# Patient Record
Sex: Female | Born: 1960 | Race: White | Hispanic: No | Marital: Married | Smoking: Current every day smoker
Health system: Southern US, Community
[De-identification: ages and names within clinical notes are randomized; demographics above are authoritative.]

## PROBLEM LIST (undated history)

## (undated) DIAGNOSIS — I509 Heart failure, unspecified: Secondary | ICD-10-CM

## (undated) DIAGNOSIS — J449 Chronic obstructive pulmonary disease, unspecified: Secondary | ICD-10-CM

## (undated) DIAGNOSIS — I429 Cardiomyopathy, unspecified: Secondary | ICD-10-CM

## (undated) HISTORY — PX: APPENDECTOMY: SHX54

## (undated) HISTORY — PX: HYSTEROPLASTY: SHX988

## (undated) HISTORY — PX: NECK EXPLORATION: SHX2077

---

## 2015-08-07 ENCOUNTER — Emergency Department (HOSPITAL_COMMUNITY): Payer: Medicaid Other

## 2015-08-07 ENCOUNTER — Encounter (HOSPITAL_COMMUNITY): Payer: Self-pay | Admitting: Nurse Practitioner

## 2015-08-07 ENCOUNTER — Emergency Department (HOSPITAL_COMMUNITY)
Admission: EM | Admit: 2015-08-07 | Discharge: 2015-08-07 | Disposition: A | Discharge status: Home / Self Care | Payer: Medicaid Other | Attending: Emergency Medicine | Admitting: Emergency Medicine

## 2015-08-07 DIAGNOSIS — Z7901 Long term (current) use of anticoagulants: Secondary | ICD-10-CM | POA: Diagnosis not present

## 2015-08-07 DIAGNOSIS — Z7951 Long term (current) use of inhaled steroids: Secondary | ICD-10-CM | POA: Diagnosis not present

## 2015-08-07 DIAGNOSIS — Z720 Tobacco use: Secondary | ICD-10-CM | POA: Insufficient documentation

## 2015-08-07 DIAGNOSIS — J449 Chronic obstructive pulmonary disease, unspecified: Secondary | ICD-10-CM | POA: Diagnosis not present

## 2015-08-07 DIAGNOSIS — R1013 Epigastric pain: Secondary | ICD-10-CM | POA: Diagnosis present

## 2015-08-07 DIAGNOSIS — Z792 Long term (current) use of antibiotics: Secondary | ICD-10-CM | POA: Diagnosis not present

## 2015-08-07 DIAGNOSIS — I509 Heart failure, unspecified: Secondary | ICD-10-CM | POA: Insufficient documentation

## 2015-08-07 DIAGNOSIS — R1011 Right upper quadrant pain: Secondary | ICD-10-CM

## 2015-08-07 DIAGNOSIS — Z79899 Other long term (current) drug therapy: Secondary | ICD-10-CM | POA: Insufficient documentation

## 2015-08-07 DIAGNOSIS — K8020 Calculus of gallbladder without cholecystitis without obstruction: Secondary | ICD-10-CM

## 2015-08-07 HISTORY — DX: Cardiomyopathy, unspecified: I42.9

## 2015-08-07 HISTORY — DX: Heart failure, unspecified: I50.9

## 2015-08-07 HISTORY — DX: Chronic obstructive pulmonary disease, unspecified: J44.9

## 2015-08-07 LAB — COMPREHENSIVE METABOLIC PANEL
ALT: 48 U/L (ref 14–54)
ANION GAP: 9 (ref 5–15)
AST: 61 U/L — ABNORMAL HIGH (ref 15–41)
Albumin: 3.9 g/dL (ref 3.5–5.0)
Alkaline Phosphatase: 120 U/L (ref 38–126)
BUN: 22 mg/dL — ABNORMAL HIGH (ref 6–20)
CHLORIDE: 107 mmol/L (ref 101–111)
CO2: 25 mmol/L (ref 22–32)
Calcium: 9.3 mg/dL (ref 8.9–10.3)
Creatinine, Ser: 0.8 mg/dL (ref 0.44–1.00)
Glucose, Bld: 91 mg/dL (ref 65–99)
POTASSIUM: 3.6 mmol/L (ref 3.5–5.1)
SODIUM: 141 mmol/L (ref 135–145)
Total Bilirubin: 0.5 mg/dL (ref 0.3–1.2)
Total Protein: 7 g/dL (ref 6.5–8.1)

## 2015-08-07 LAB — CBC WITH DIFFERENTIAL/PLATELET
Basophils Absolute: 0.1 10*3/uL (ref 0.0–0.1)
Basophils Relative: 1 %
EOS ABS: 0.3 10*3/uL (ref 0.0–0.7)
EOS PCT: 3 %
HCT: 39.7 % (ref 36.0–46.0)
Hemoglobin: 13.3 g/dL (ref 12.0–15.0)
LYMPHS ABS: 1.4 10*3/uL (ref 0.7–4.0)
LYMPHS PCT: 13 %
MCH: 29 pg (ref 26.0–34.0)
MCHC: 33.5 g/dL (ref 30.0–36.0)
MCV: 86.7 fL (ref 78.0–100.0)
MONO ABS: 0.7 10*3/uL (ref 0.1–1.0)
Monocytes Relative: 6 %
NEUTROS PCT: 77 %
Neutro Abs: 8.5 10*3/uL — ABNORMAL HIGH (ref 1.7–7.7)
PLATELETS: 232 10*3/uL (ref 150–400)
RBC: 4.58 MIL/uL (ref 3.87–5.11)
RDW: 14.7 % (ref 11.5–15.5)
WBC: 11 10*3/uL — ABNORMAL HIGH (ref 4.0–10.5)

## 2015-08-07 LAB — URINALYSIS, ROUTINE W REFLEX MICROSCOPIC
Bilirubin Urine: NEGATIVE
Glucose, UA: NEGATIVE mg/dL
Hgb urine dipstick: NEGATIVE
KETONES UR: NEGATIVE mg/dL
NITRITE: NEGATIVE
PH: 6.5 (ref 5.0–8.0)
Protein, ur: NEGATIVE mg/dL
SPECIFIC GRAVITY, URINE: 1.019 (ref 1.005–1.030)
Urobilinogen, UA: 1 mg/dL (ref 0.0–1.0)

## 2015-08-07 LAB — URINE MICROSCOPIC-ADD ON

## 2015-08-07 LAB — LIPASE, BLOOD: LIPASE: 40 U/L (ref 11–51)

## 2015-08-07 MED ORDER — HYDROMORPHONE HCL 1 MG/ML IJ SOLN
1.0000 mg | Freq: Once | INTRAMUSCULAR | Status: AC
  Administered 2015-08-07: 1 mg via INTRAVENOUS
  Filled 2015-08-07: qty 1

## 2015-08-07 MED ORDER — OXYCODONE-ACETAMINOPHEN 5-325 MG PO TABS: 1.0000 | ORAL_TABLET | ORAL | Status: AC | PRN

## 2015-08-07 MED ORDER — SODIUM CHLORIDE 0.9 % IV BOLUS (SEPSIS)
500.0000 mL | Freq: Once | INTRAVENOUS | Status: AC
  Administered 2015-08-07: 500 mL via INTRAVENOUS

## 2015-08-07 MED ORDER — PROMETHAZINE HCL 25 MG PO TABS: 25.0000 mg | ORAL_TABLET | Freq: Four times a day (QID) | ORAL | Status: AC | PRN

## 2015-08-07 MED ORDER — IOHEXOL 300 MG/ML  SOLN
100.0000 mL | Freq: Once | INTRAMUSCULAR | Status: AC | PRN
  Administered 2015-08-07: 100 mL via INTRAVENOUS

## 2015-08-07 MED ORDER — PROMETHAZINE HCL 25 MG/ML IJ SOLN
12.5000 mg | Freq: Once | INTRAMUSCULAR | Status: AC
  Administered 2015-08-07: 12.5 mg via INTRAVENOUS
  Filled 2015-08-07: qty 1

## 2015-08-07 NOTE — ED Notes (Signed)
Pt is presented in mild pain induced RUQ, hx of gallstones with hospitalization for liver related issues. Other symptoms include n/v, also being treated IV phlebitis from recent hospitalization. [place] is from [place] town, all hx is being provided by pt as chart review isn't yielding much.

## 2015-08-07 NOTE — ED Notes (Signed)
US at bedside

## 2015-08-07 NOTE — Discharge Instructions (Signed)
Medication for pain and nausea. Nurse will give you copies of the CT scan and ultrasound to take back to [place]. Recommend close follow-up early in week for your abdominal pain. Avoid rich greasy foods.

## 2015-08-07 NOTE — ED Notes (Signed)
Bed: [id number] Expected date: 08/07/15 Expected time: 3:56 PM Means of arrival: Ambulance Comments: Gall Stones

## 2015-08-07 NOTE — ED Notes (Signed)
Pt cannot use restroom at this time, aware specimen is needed. 

## 2015-08-08 NOTE — ED Provider Notes (Signed)
CSN: [id number]     Arrival date & time 08/07/15  1604 History   First MD Initiated Contact with Patient 08/07/15 1612     Chief Complaint  Patient presents with  . Abdominal Pain    R/O Gall Stones     (Consider location/radiation/quality/duration/timing/severity/associated sxs/prior Treatment) HPI.... Epigastric pain for approximate 2 weeks, worse the past 3 days. Patient is from [place] and has been diagnosed with gallbladder disease. Sxs are still being evaluated and  no final determination has been made for surgery. No fever, sweats, chills, jaundice. She has taken nothing for pain at home. Severity is moderate. No radiation of pain.  Past Medical History  Diagnosis Date  . COPD (chronic obstructive pulmonary disease) (HCC)   . Cardiomyopathy (HCC)   . CHF (congestive heart failure) [facility])    Past Surgical History  Procedure Laterality Date  . Appendectomy    . Hysteroplasty    . Neck exploration     History reviewed. No pertinent family history. Social History  Substance Use Topics  . Smoking status: Current Every Day Smoker -- 0.50 packs/day    Types: Cigarettes  . Smokeless tobacco: Former Neurosurgeon  . Alcohol Use: No   OB History    No data available     Review of Systems    Allergies  Zofran; Nsaids; and Tramadol  Home Medications   Prior to Admission medications   Medication Sig Start Date End Date Taking? Authorizing Provider  albuterol (PROVENTIL HFA;VENTOLIN HFA) 108 (90 BASE) MCG/ACT inhaler Inhale 2 puffs into the lungs every 6 (six) hours as needed for wheezing or shortness of breath.   Yes Historical Provider, MD  albuterol (PROVENTIL) (2.5 MG/3ML) 0.083% nebulizer solution Take 2.5 mg by nebulization every 8 (eight) hours.   Yes Historical Provider, MD  alprazolam Prudy Feeler) 2 MG tablet Take 2 mg by mouth daily as needed for anxiety.   Yes Historical Provider, MD  budesonide-formoterol (SYMBICORT) 80-4.5 MCG/ACT inhaler Inhale 2 puffs into the lungs 2  (two) times daily.   Yes Historical Provider, MD  Cholecalciferol (VITAMIN D3) 2000 UNITS TABS Take 1 tablet by mouth daily.   Yes Historical Provider, MD  clindamycin (CLEOCIN) 300 MG capsule Take 300 mg by mouth every 6 (six) hours.   Yes Historical Provider, MD  clonazePAM (KLONOPIN) 2 MG tablet Take 2 mg by mouth 2 (two) times daily.   Yes Historical Provider, MD  diltiazem (CARDIZEM CD) 120 MG 24 hr capsule Take 120 mg by mouth daily.   Yes Historical Provider, MD  furosemide (LASIX) 20 MG tablet Take 20 mg by mouth daily.     Historical Provider, MD  HYDROcodone-acetaminophen (NORCO) 10-325 MG tablet Take 1-2 tablets by mouth every 6 (six) hours as needed for moderate pain.   Yes Historical Provider, MD  levalbuterol (XOPENEX) 1.25 MG/0.5ML nebulizer solution Take 1.25 mg by nebulization every 4 (four) hours as needed for wheezing or shortness of breath.   Yes Historical Provider, MD  nystatin (MYCOSTATIN) 100000 UNIT/ML suspension Take 5 mLs by mouth every 8 (eight) hours.    Historical Provider, MD  oxyCODONE-acetaminophen (PERCOCET) 5-325 MG tablet Take 1-2 tablets by mouth every 4 (four) hours as needed. 08/07/15   Donnetta Hutching, MD  pantoprazole (PROTONIX) 40 MG tablet Take 40 mg by mouth daily.   Yes Historical Provider, MD  potassium chloride (K-DUR) 10 MEQ tablet Take 10 mEq by mouth daily.   Yes Historical Provider, MD  promethazine (PHENERGAN) 25 MG tablet Take 1 tablet (  25 mg total) by mouth every 6 (six) hours as needed for nausea or vomiting. 08/07/15   Donnetta Hutching, MD  rivaroxaban (XARELTO) 20 MG TABS tablet Take 20 mg by mouth.    Historical Provider, MD  tiotropium (SPIRIVA) 18 MCG inhalation capsule Place 18 mcg into inhaler and inhale daily.   Yes Historical Provider, MD   BP 139/88 mmHg  Pulse 79  Temp(Src) 97.6 F (36.4 C) (Oral)  Resp 12  SpO2 100% Physical Exam  Constitutional: She is oriented to person, place, and time. She appears well-developed and well-nourished.   HENT:  Head: Normocephalic and atraumatic.  Eyes: Conjunctivae and EOM are normal. Pupils are equal, round, and reactive to light.  Neck: Normal range of motion. Neck supple.  Cardiovascular: Normal rate and regular rhythm.   Pulmonary/Chest: Effort normal and breath sounds normal.  Abdominal: Soft. Bowel sounds are normal.  Tender epigastrium  Musculoskeletal: Normal range of motion.  Neurological: She is alert and oriented to person, place, and time.  Skin: Skin is warm and dry.  Psychiatric: She has a normal mood and affect. Her behavior is normal.  Nursing note and vitals reviewed.   ED Course  Procedures (including critical care time) Labs Review Labs Reviewed  CBC WITH DIFFERENTIAL/PLATELET - Abnormal; Notable for the following:    WBC 11.0 (*)    Neutro Abs 8.5 (*)    All other components within normal limits  COMPREHENSIVE METABOLIC PANEL - Abnormal; Notable for the following:    BUN 22 (*)    AST 61 (*)    All other components within normal limits  URINALYSIS, ROUTINE W REFLEX MICROSCOPIC (NOT AT [facility]) - Abnormal; Notable for the following:    Leukocytes, UA MODERATE (*)    All other components within normal limits  LIPASE, BLOOD  URINE MICROSCOPIC-ADD ON    Imaging Review Ct Abdomen Pelvis W Contrast  08/07/2015  CLINICAL DATA:  Cholelithiasis. Right upper quadrant abdominal pain. Indeterminate 2.1 cm right liver mass on sonogram from earlier today. EXAM: CT ABDOMEN AND PELVIS WITH CONTRAST TECHNIQUE: Multidetector CT imaging of the abdomen and pelvis was performed using the standard protocol following bolus administration of intravenous contrast. CONTRAST:  OMNIPAQUE IOHEXOL 300 MG/ML  SOLN COMPARISON:  Right upper quadrant abdominal sonogram from earlier today. FINDINGS: Lower chest: Hypoventilatory changes in the dependent lung bases. Hepatobiliary: There is a 2.2 x 2.0 cm segment 5 right liver lobe mass (series 2/ image 20), which demonstrates progressive  discontinuous peripheral nodular enhancement, in keeping with a benign hemangioma. There are additional subcentimeter hypodense lesions in the segment 7 right liver lobe and anterior segment 4A left liver lobe, too small to characterize. There is focal intrahepatic biliary ductal dilatation in the medial segment left liver lobe (best seen on coronal series 4/ image 53), without associated mass, filling defect, pneumobilia or overlying capsular retraction. Gallbladder contains layering subcentimeter gallstones, with no gallbladder wall thickening or pericholecystic fat stranding or fluid. The remaining intrahepatic bile ducts are normal caliber. The common bile duct measures 4 mm diameter, normal. Pancreas: Normal, with no mass or duct dilation. Spleen: Normal size. No mass. Adrenals/Urinary Tract: Normal adrenals. Normal kidneys with no hydronephrosis and no renal mass. Normal bladder. Stomach/Bowel: Grossly normal stomach. Normal caliber small bowel with no small bowel wall thickening. Status post appendectomy. Normal large bowel with no diverticulosis, large bowel wall thickening or pericolonic fat stranding. Vascular/Lymphatic: Atherosclerotic nonaneurysmal abdominal aorta. Patent portal, splenic, hepatic and renal veins. No pathologically enlarged lymph  nodes in the abdomen or pelvis. Reproductive: Status post hysterectomy, with no abnormal findings at the vaginal cuff. No adnexal mass. Other: No pneumoperitoneum, ascites or focal fluid collection. Musculoskeletal: No aggressive appearing focal osseous lesions. IMPRESSION: 1. Benign right liver lobe hemangioma accounts for the indeterminate right liver lobe mass seen on the sonogram from earlier today. 2. Focal intrahepatic biliary ductal dilatation in the medial segment left liver lobe, without appreciable associated liver mass, filling defects, pneumobilia or overlying capsular retraction. Normal caliber common bile duct. This finding raises the possibility of  primary or secondary sclerosing cholangitis. An MRI abdomen with and without intravenous contrast and with MRCP is advised for further evaluation to exclude an underlying associated liver mass. 3. Cholelithiasis.  No evidence of acute cholecystitis. Electronically Signed   By: Delbert PhenixJason A Poff M.D.   On: 08/07/2015 21:16   [place] Abdomen Limited Ruq  08/07/2015  CLINICAL DATA:  Right upper quadrant pain EXAM: US ABDOMEN LIMITED - RIGHT UPPER QUADRANT COMPARISON:  None. FINDINGS: Gallbladder: Small echogenic cholelithiasis with the largest measuring 4 mm. No pericholecystic fluid or gallbladder wall thickening. Negative sonographic Murphy sign. Common bile duct: Diameter: 4 mm Liver: Complex hypoechoic mass in the right hepatic lobe measuring 2.1 x 1.6 x 1.9 cm. Within normal limits in parenchymal echogenicity. IMPRESSION: 1. Cholelithiasis without sonographic evidence of acute cholecystitis. 2. Complex hypoechoic mass in the right hepatic lobe measuring 2.1 x 1.6 x 1.9 cm. The appearance is nonspecific and include both benign and malignant etiologies. Recommend further evaluation with a nonemergent CT or MRI of the abdomen. Electronically Signed   By: Elige KoHetal  Patel   On: 08/07/2015 17:53   I have personally reviewed and evaluated these images and lab results as part of my medical decision-making.   EKG Interpretation None      MDM   Final diagnoses:  Calculus of gallbladder without cholecystitis without obstruction    Patient feels much better after IV fluids and pain management. AST is minimally elevated, but bilirubin and alkaline phosphatase are normal. Results of ultrasound and CT scan discussed with gastroenterologist on call and the patient.  His opinion was that patient can be treated as an outpatient in [place]. Discharge medications Percocet and Phenergan 25 mg.      Donnetta HutchingBrian Swati Granberry, MD 08/08/15 [phone]

## 2016-10-15 IMAGING — CT CT ABD-PELV W/ CM
2 of 5 series · 15 of 46 positions shown, 17 images · IV contrast (omnipaque)
Comparison: Right upper quadrant abdominal sonogram from earlier
today.

CLINICAL DATA: Cholelithiasis. Right upper quadrant abdominal pain.
Indeterminate 2.1 cm right liver mass on sonogram from earlier
today.

EXAM:
CT ABDOMEN AND PELVIS WITH CONTRAST
TECHNIQUE: Multidetector CT imaging of the abdomen and pelvis was performed
using the standard protocol following bolus administration of
intravenous contrast.
CONTRAST:  100mL OMNIPAQUE IOHEXOL 300 MG/ML  SOLN

[Series 2: abd/pel with · axial · 0.74mm/px · z∈[+975,+1395]mm · 12 of 94 slices shown, 14 images]
[im 5/94  soft-tissue]
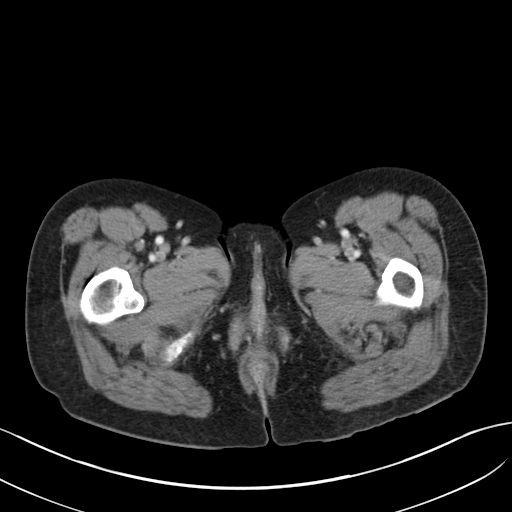
[im 5/94  bone]
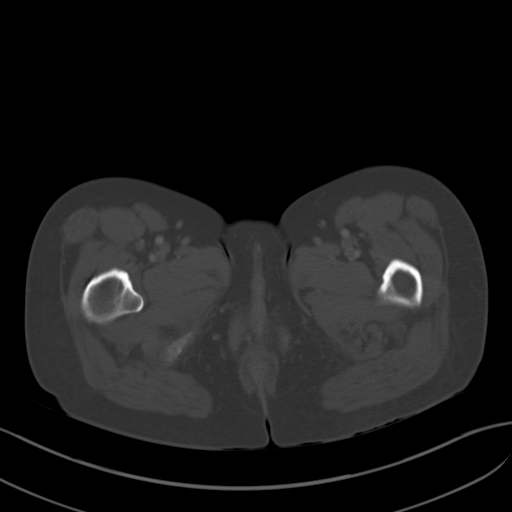
[im 15/94  soft-tissue]
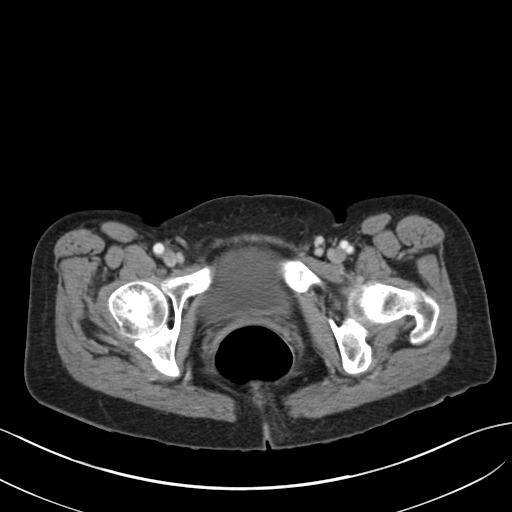
[im 20/94  soft-tissue]
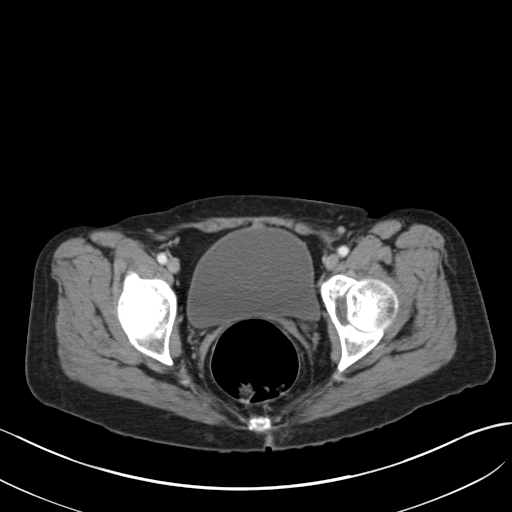
[im 30/94  soft-tissue]
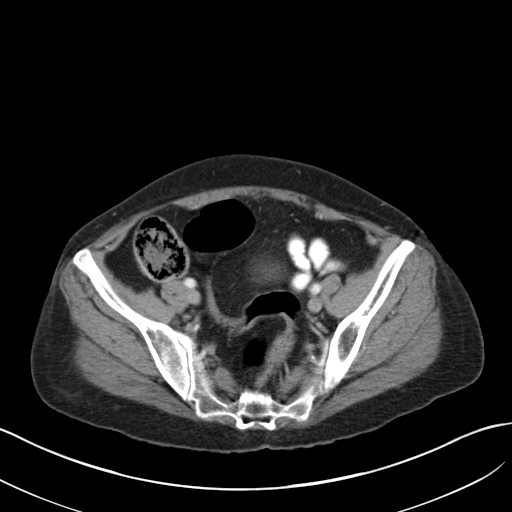
[im 35/94  soft-tissue]
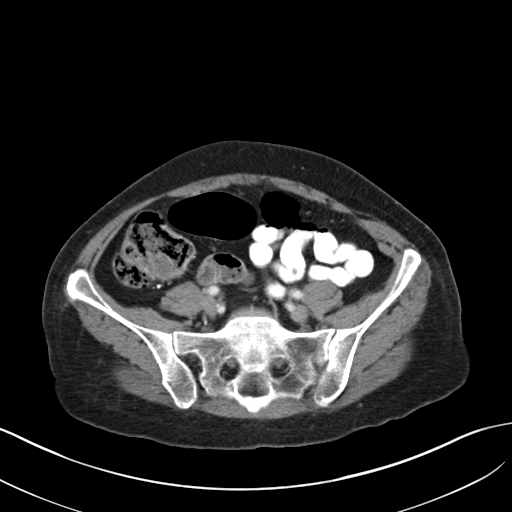
[im 45/94  soft-tissue]
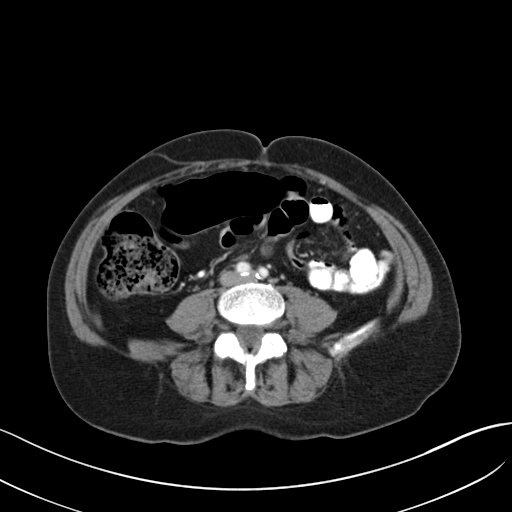
[im 49/94  soft-tissue]
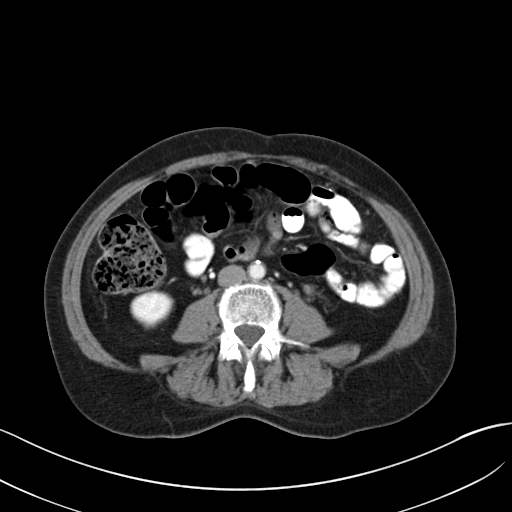
[im 59/94  soft-tissue]
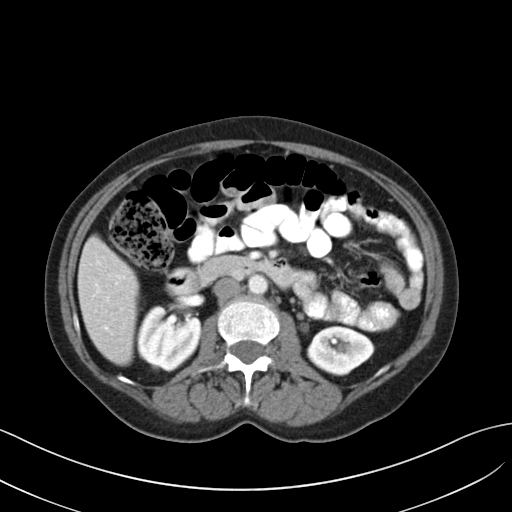
[im 64/94  soft-tissue]
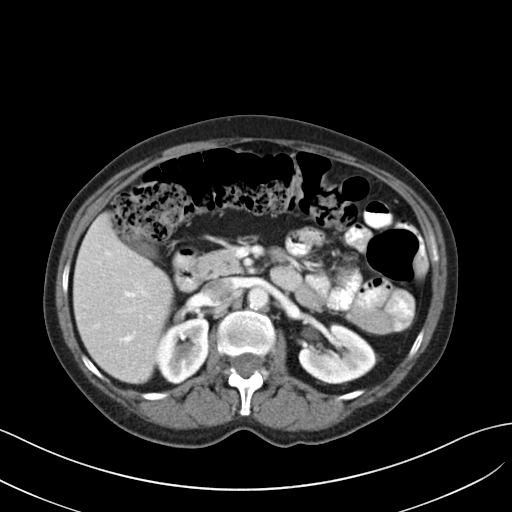
[im 64/94  bone]
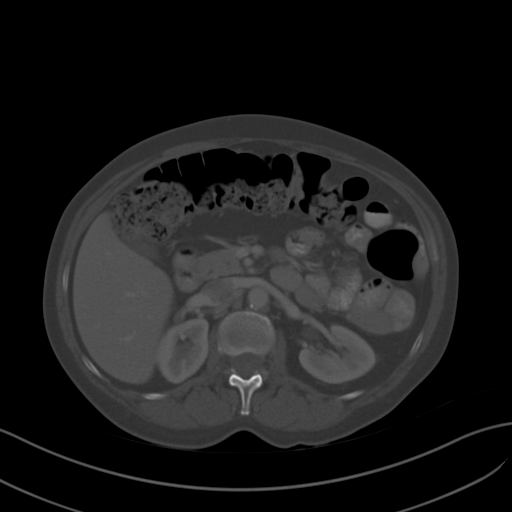
[im 74/94  soft-tissue]
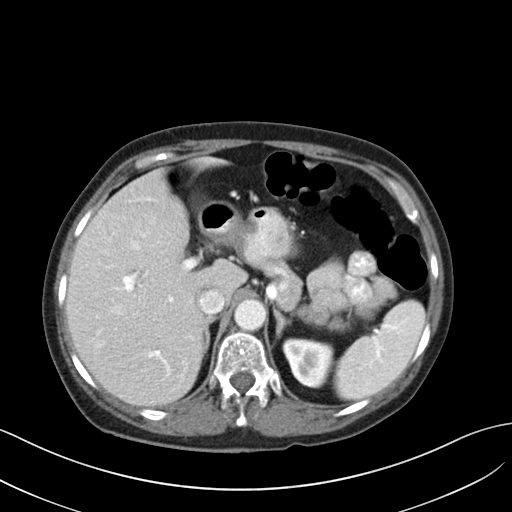
[im 79/94  soft-tissue]
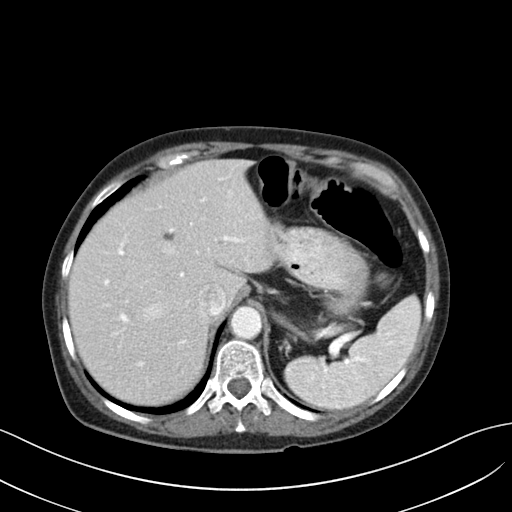
[im 89/94  soft-tissue]
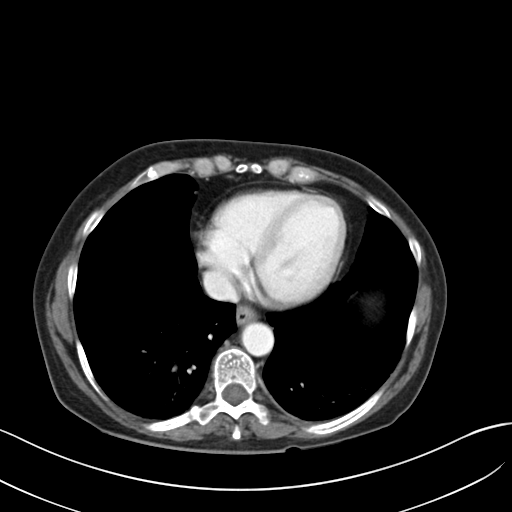

[Series 4: coronal a/|p · coronal · 0.74mm/px · 3 of 127 slices shown]
[im 43/127  soft-tissue]
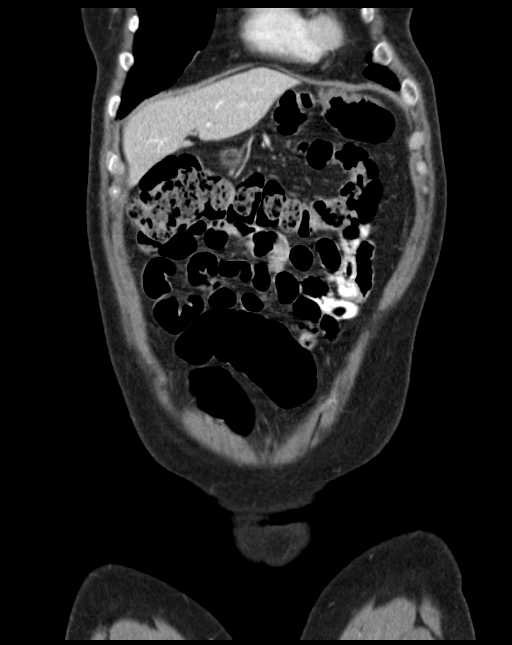
[im 57/127  soft-tissue]
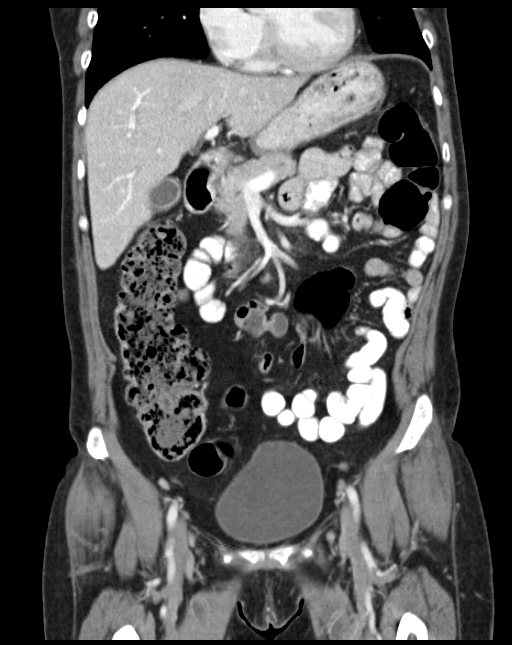
[im 71/127  soft-tissue]
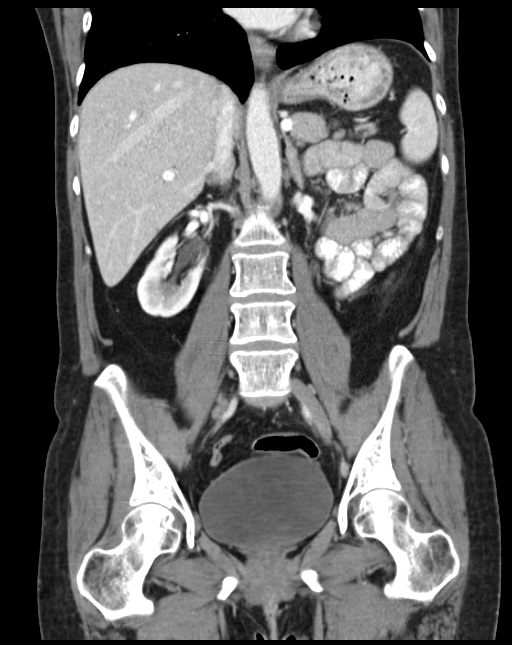

[15 of 46 positions shown; findings below may reference images not displayed]

FINDINGS: Lower chest: Hypoventilatory changes in the dependent lung bases.

Hepatobiliary: There is a 2.2 x 2.0 cm segment 5 right liver lobe
mass (series 2/ image 20), which demonstrates progressive
discontinuous peripheral nodular enhancement, in keeping with a
benign hemangioma. There are additional subcentimeter hypodense
lesions in the segment 7 right liver lobe and anterior segment 4A
left liver lobe, too small to characterize. There is focal
intrahepatic biliary ductal dilatation in the medial segment left
liver lobe (best seen on coronal series 4/ image 53), without
associated mass, filling defect, pneumobilia or overlying capsular
retraction. Gallbladder contains layering subcentimeter gallstones,
with no gallbladder wall thickening or pericholecystic fat stranding
or fluid. The remaining intrahepatic bile ducts are normal caliber.
The common bile duct measures 4 mm diameter, normal.

Pancreas: Normal, with no mass or duct dilation.

Spleen: Normal size. No mass.

Adrenals/Urinary Tract: Normal adrenals. Normal kidneys with no
hydronephrosis and no renal mass. Normal bladder.

Stomach/Bowel: Grossly normal stomach. Normal caliber small bowel
with no small bowel wall thickening. Status post appendectomy.
Normal large bowel with no diverticulosis, large bowel wall
thickening or pericolonic fat stranding.

Vascular/Lymphatic: Atherosclerotic nonaneurysmal abdominal aorta.
Patent portal, splenic, hepatic and renal veins. No pathologically
enlarged lymph nodes in the abdomen or pelvis.

Reproductive: Status post hysterectomy, with no abnormal findings at
the vaginal cuff. No adnexal mass.

Other: No pneumoperitoneum, ascites or focal fluid collection.

Musculoskeletal: No aggressive appearing focal osseous lesions.
IMPRESSION: 1. Benign right liver lobe hemangioma accounts for the indeterminate
right liver lobe mass seen on the sonogram from earlier today.
2. Focal intrahepatic biliary ductal dilatation in the medial
segment left liver lobe, without appreciable associated liver mass,
filling defects, pneumobilia or overlying capsular retraction.
Normal caliber common bile duct. This finding raises the possibility
of primary or secondary sclerosing cholangitis. An MRI abdomen with
and without intravenous contrast and with MRCP is advised for
further evaluation to exclude an underlying associated liver mass.
3. Cholelithiasis.  No evidence of acute cholecystitis.

## 2017-07-31 IMAGING — US US ABDOMEN LIMITED
1 series · 14 of 25 positions shown · non-contrast
Comparison: None.

CLINICAL DATA: Right upper quadrant pain

EXAM:
US ABDOMEN LIMITED - RIGHT UPPER QUADRANT

[Series 1: us abdomen limited · 0.18mm/px · 14 of 38 slices shown]
[im 1/38]
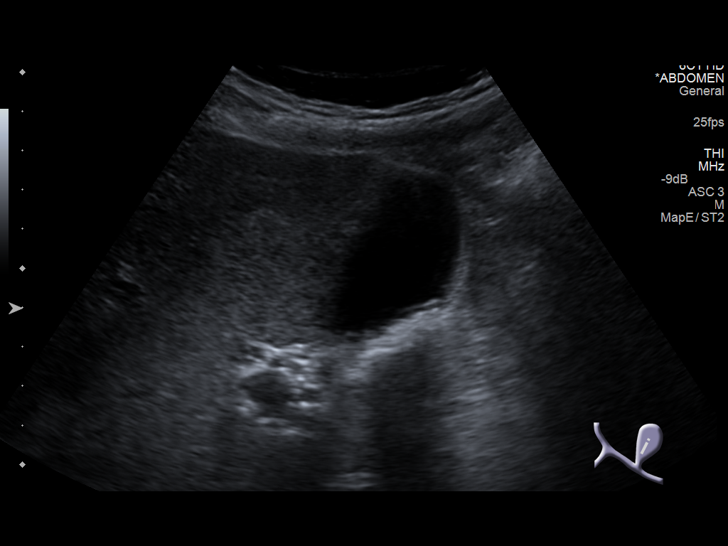
[im 4/38]
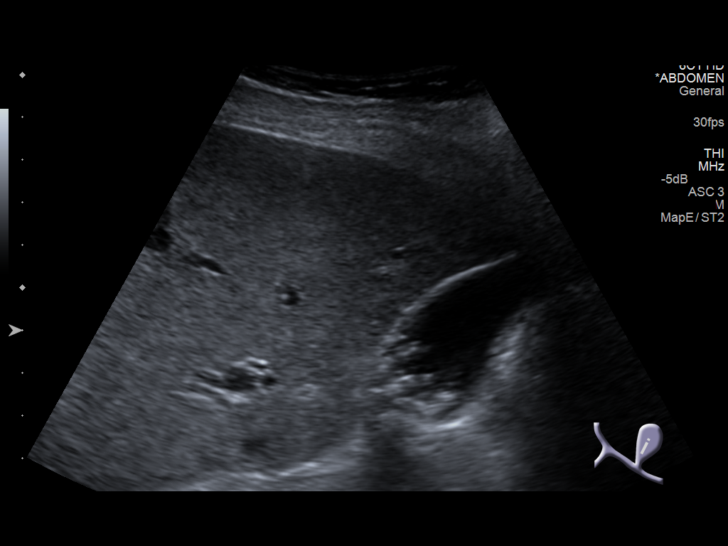
[im 7/38]
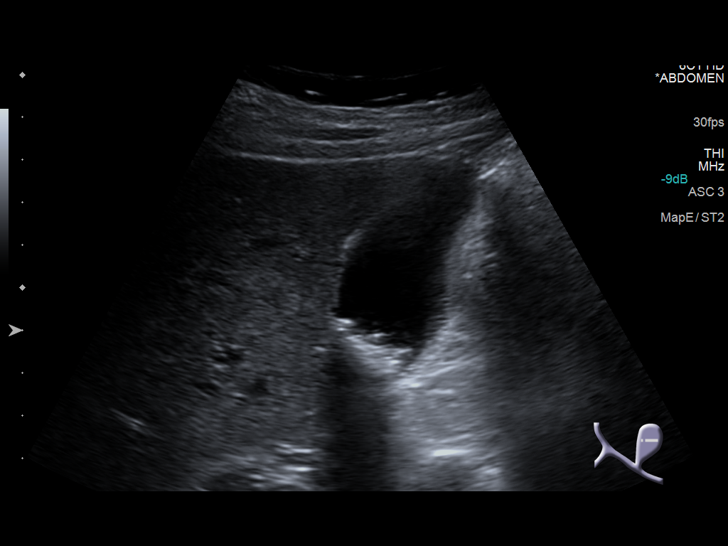
[im 10/38]
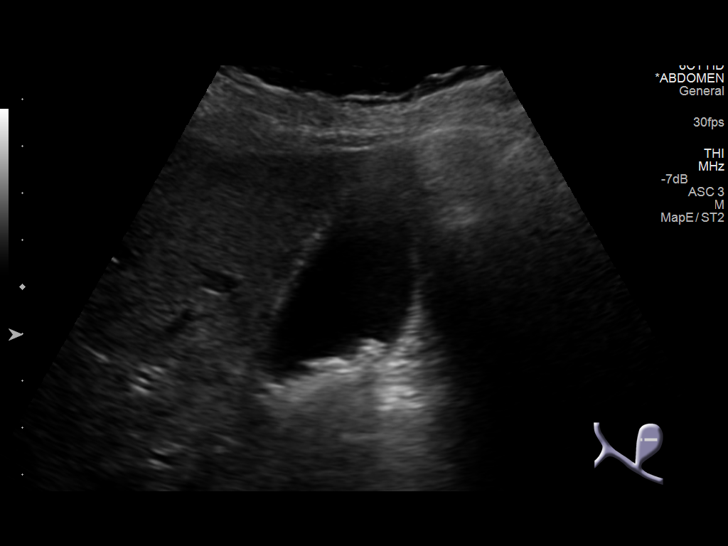
[im 13/38]
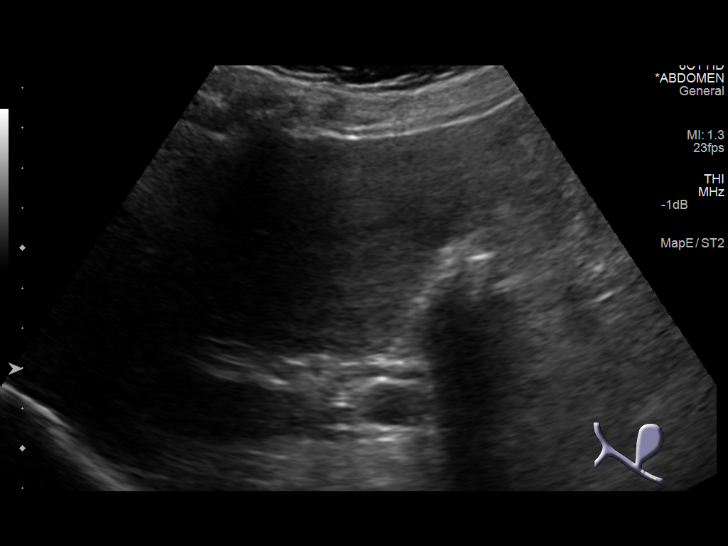
[im 14/38]
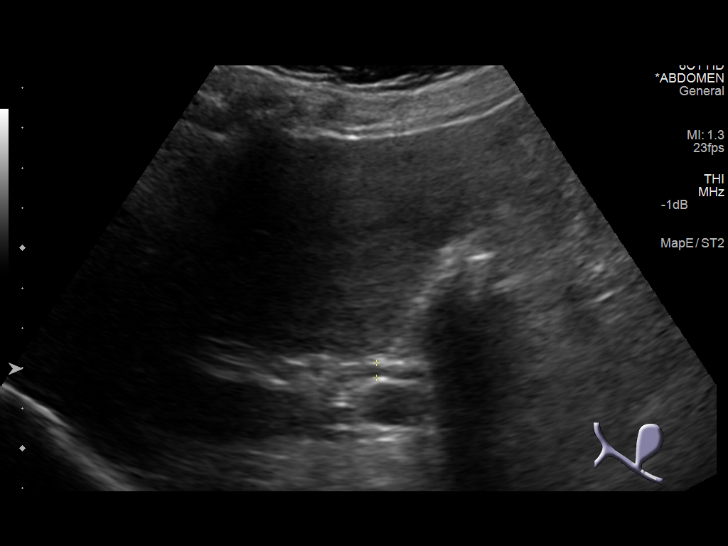
[im 17/38]
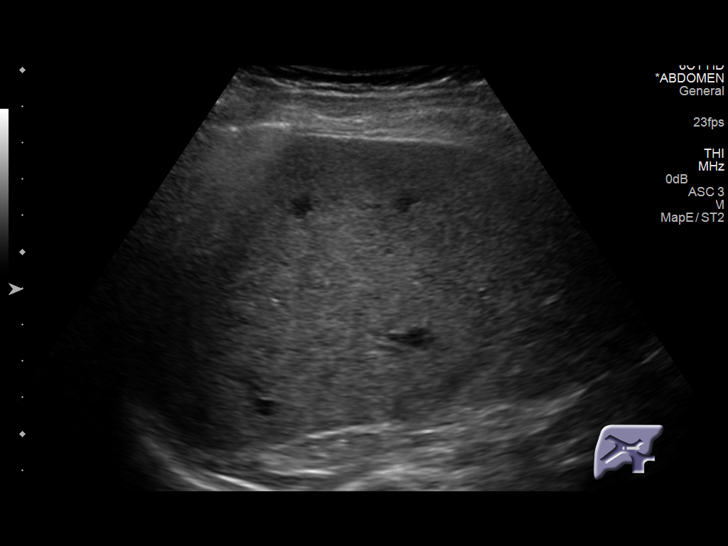
[im 21/38]
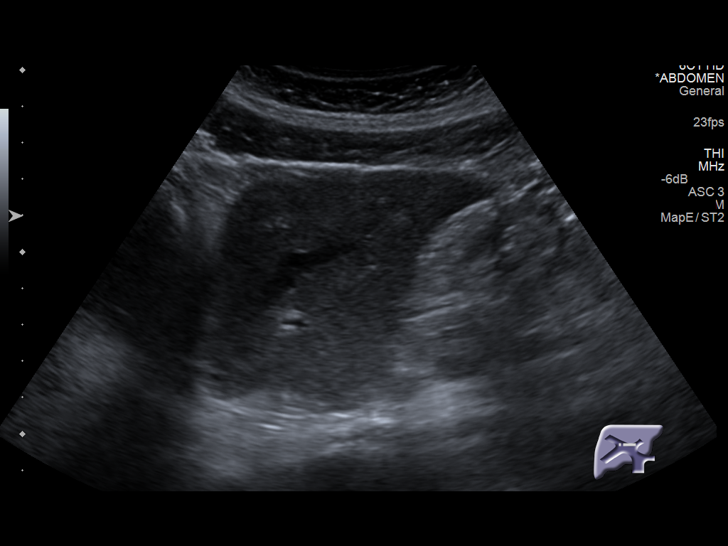
[im 24/38]
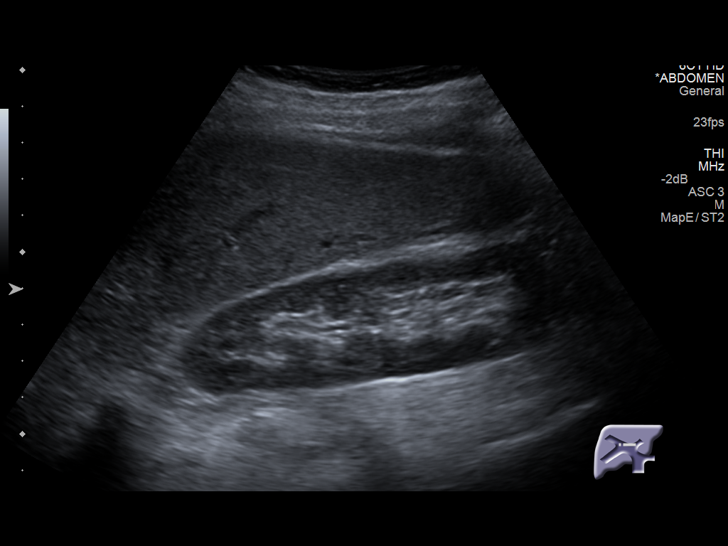
[im 25/38]
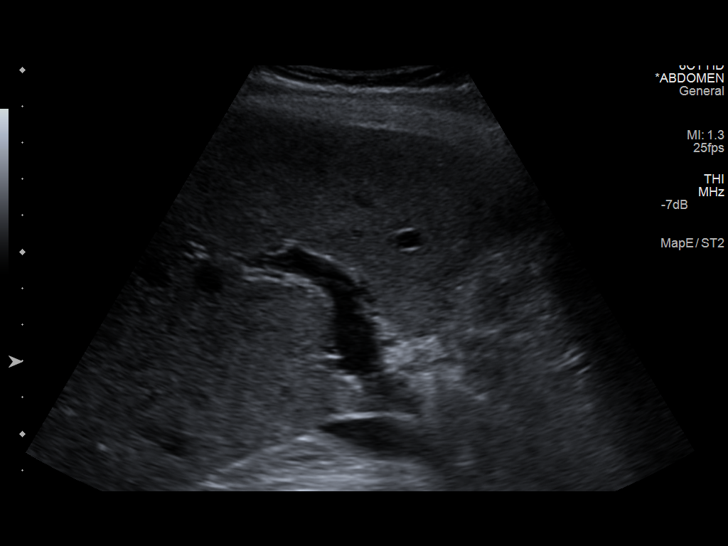
[im 28/38]
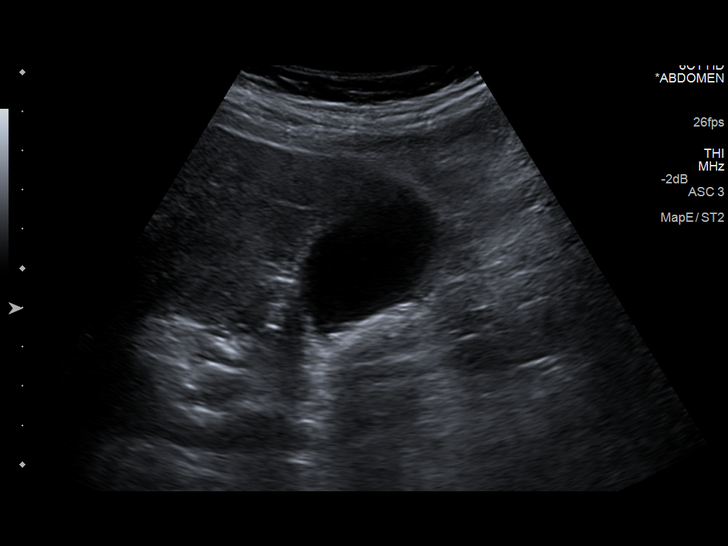
[im 31/38]
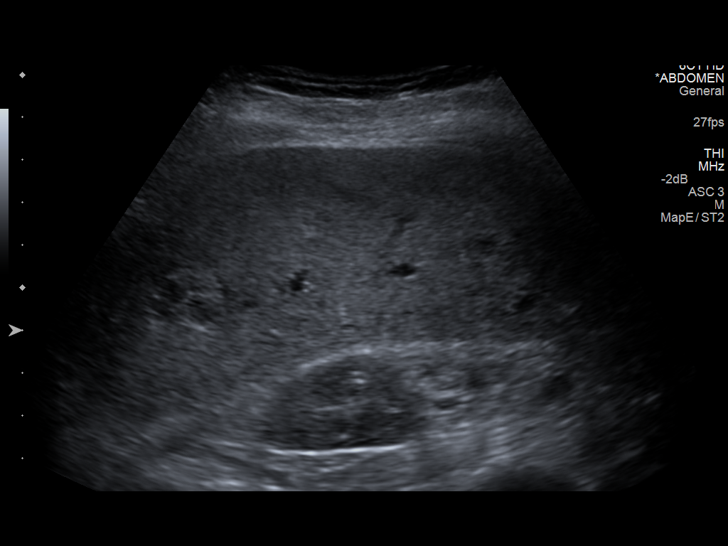
[im 34/38]
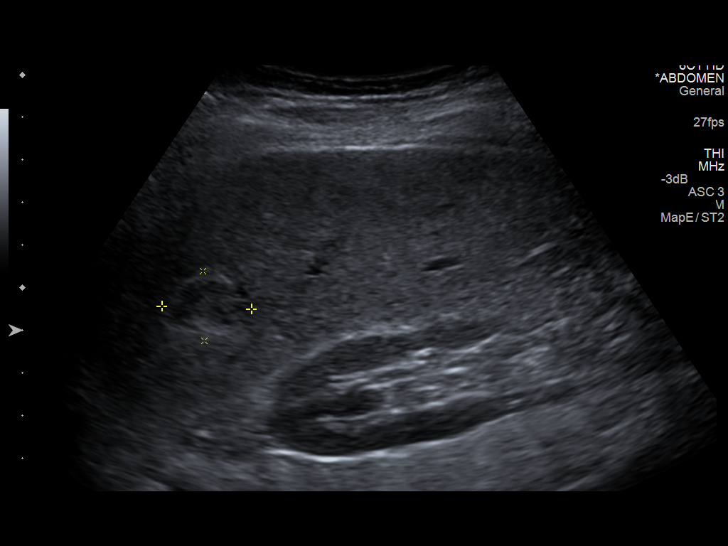
[im 38/38]
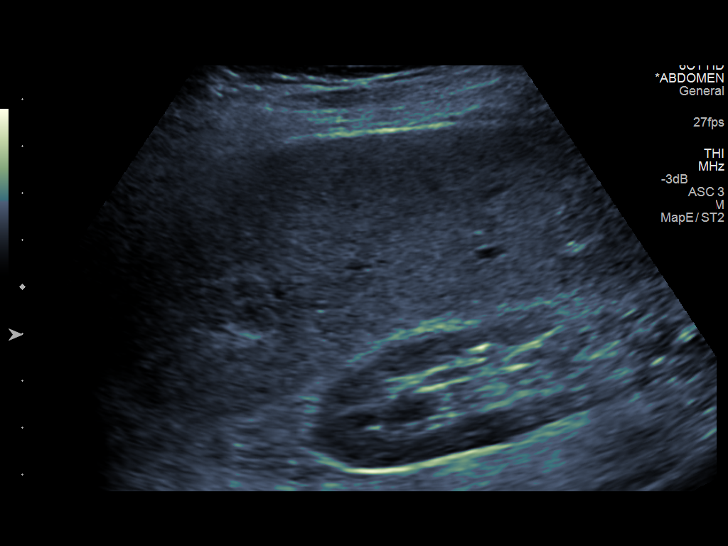

[14 of 25 positions shown; findings below may reference images not displayed]

FINDINGS: Gallbladder:

Small echogenic cholelithiasis with the largest measuring 4 mm. No
pericholecystic fluid or gallbladder wall thickening. Negative
sonographic Murphy sign.

Common bile duct:

Diameter: 4 mm

Liver:

Complex hypoechoic mass in the right hepatic lobe measuring 2.1 x
1.6 x 1.9 cm. Within normal limits in parenchymal echogenicity.
IMPRESSION: 1. Cholelithiasis without sonographic evidence of acute
cholecystitis.
2. Complex hypoechoic mass in the right hepatic lobe measuring 2.1 x
1.6 x 1.9 cm. The appearance is nonspecific and include both benign
and malignant etiologies. Recommend further evaluation with a
nonemergent CT or MRI of the abdomen.
